# Patient Record
Sex: Female | Born: 1960 | Race: White | Hispanic: No | Marital: Married | Smoking: Never smoker
Health system: Southern US, Community
[De-identification: ages and names within clinical notes are randomized; demographics above are authoritative.]

## PROBLEM LIST (undated history)

## (undated) DIAGNOSIS — K219 Gastro-esophageal reflux disease without esophagitis: Secondary | ICD-10-CM

## (undated) DIAGNOSIS — R112 Nausea with vomiting, unspecified: Secondary | ICD-10-CM

## (undated) DIAGNOSIS — S060X9A Concussion with loss of consciousness of unspecified duration, initial encounter: Secondary | ICD-10-CM

## (undated) DIAGNOSIS — K5900 Constipation, unspecified: Secondary | ICD-10-CM

## (undated) DIAGNOSIS — F329 Major depressive disorder, single episode, unspecified: Secondary | ICD-10-CM

## (undated) DIAGNOSIS — F99 Mental disorder, not otherwise specified: Secondary | ICD-10-CM

## (undated) DIAGNOSIS — F419 Anxiety disorder, unspecified: Secondary | ICD-10-CM

## (undated) DIAGNOSIS — T8859XA Other complications of anesthesia, initial encounter: Secondary | ICD-10-CM

## (undated) DIAGNOSIS — M1990 Unspecified osteoarthritis, unspecified site: Secondary | ICD-10-CM

## (undated) DIAGNOSIS — I341 Nonrheumatic mitral (valve) prolapse: Secondary | ICD-10-CM

## (undated) DIAGNOSIS — F32A Depression, unspecified: Secondary | ICD-10-CM

## (undated) DIAGNOSIS — Z9889 Other specified postprocedural states: Secondary | ICD-10-CM

## (undated) DIAGNOSIS — T4145XA Adverse effect of unspecified anesthetic, initial encounter: Secondary | ICD-10-CM

## (undated) HISTORY — PX: OTHER SURGICAL HISTORY: SHX169

## (undated) HISTORY — PX: CHOLECYSTECTOMY: SHX55

## (undated) HISTORY — PX: TONSILLECTOMY: SUR1361

## (undated) HISTORY — PX: UMBILICAL HERNIA REPAIR: SHX196

## (undated) HISTORY — PX: PILONIDAL CYST / SINUS EXCISION: SUR543

---

## 1980-09-07 DIAGNOSIS — S060X9A Concussion with loss of consciousness of unspecified duration, initial encounter: Secondary | ICD-10-CM

## 1980-09-07 DIAGNOSIS — S060XAA Concussion with loss of consciousness status unknown, initial encounter: Secondary | ICD-10-CM

## 1980-09-07 HISTORY — DX: Concussion with loss of consciousness of unspecified duration, initial encounter: S06.0X9A

## 1980-09-07 HISTORY — DX: Concussion with loss of consciousness status unknown, initial encounter: S06.0XAA

## 1998-09-07 HISTORY — PX: GASTRIC BYPASS: SHX52

## 2013-10-09 ENCOUNTER — Other Ambulatory Visit: Payer: Self-pay | Admitting: Neurological Surgery

## 2013-10-10 ENCOUNTER — Encounter (HOSPITAL_COMMUNITY): Payer: Self-pay | Admitting: Pharmacy Technician

## 2013-10-17 ENCOUNTER — Ambulatory Visit (HOSPITAL_COMMUNITY)
Admission: RE | Admit: 2013-10-17 | Discharge: 2013-10-17 | Disposition: A | Discharge status: Home / Self Care | Payer: PRIVATE HEALTH INSURANCE | Source: Ambulatory Visit | Attending: Neurological Surgery | Admitting: Neurological Surgery

## 2013-10-17 ENCOUNTER — Encounter (HOSPITAL_COMMUNITY)
Admission: RE | Admit: 2013-10-17 | Discharge: 2013-10-17 | Disposition: A | Discharge status: Home / Self Care | Payer: PRIVATE HEALTH INSURANCE | Source: Ambulatory Visit | Attending: Neurological Surgery | Admitting: Neurological Surgery

## 2013-10-17 ENCOUNTER — Encounter (HOSPITAL_COMMUNITY): Payer: Self-pay

## 2013-10-17 DIAGNOSIS — Z01818 Encounter for other preprocedural examination: Secondary | ICD-10-CM | POA: Insufficient documentation

## 2013-10-17 DIAGNOSIS — Z01810 Encounter for preprocedural cardiovascular examination: Secondary | ICD-10-CM | POA: Insufficient documentation

## 2013-10-17 HISTORY — DX: Concussion with loss of consciousness of unspecified duration, initial encounter: S06.0X9A

## 2013-10-17 HISTORY — DX: Depression, unspecified: F32.A

## 2013-10-17 HISTORY — DX: Constipation, unspecified: K59.00

## 2013-10-17 HISTORY — DX: Adverse effect of unspecified anesthetic, initial encounter: T41.45XA

## 2013-10-17 HISTORY — DX: Gastro-esophageal reflux disease without esophagitis: K21.9

## 2013-10-17 HISTORY — DX: Anxiety disorder, unspecified: F41.9

## 2013-10-17 HISTORY — DX: Major depressive disorder, single episode, unspecified: F32.9

## 2013-10-17 HISTORY — DX: Unspecified osteoarthritis, unspecified site: M19.90

## 2013-10-17 HISTORY — DX: Mental disorder, not otherwise specified: F99

## 2013-10-17 HISTORY — DX: Nausea with vomiting, unspecified: R11.2

## 2013-10-17 HISTORY — DX: Nausea with vomiting, unspecified: Z98.890

## 2013-10-17 HISTORY — DX: Other complications of anesthesia, initial encounter: T88.59XA

## 2013-10-17 HISTORY — DX: Nonrheumatic mitral (valve) prolapse: I34.1

## 2013-10-17 LAB — BASIC METABOLIC PANEL
BUN: 7 mg/dL (ref 6–23)
CO2: 27 meq/L (ref 19–32)
CREATININE: 0.59 mg/dL (ref 0.50–1.10)
Calcium: 9.1 mg/dL (ref 8.4–10.5)
Chloride: 103 mEq/L (ref 96–112)
GFR calc Af Amer: >90 mL/min (ref >90)
GFR calc non Af Amer: >90 mL/min (ref >90)
Glucose, Bld: 107 mg/dL — ABNORMAL HIGH (ref 70–99)
Potassium: 4.4 mEq/L (ref 3.7–5.3)
SODIUM: 141 meq/L (ref 137–147)

## 2013-10-17 LAB — TYPE AND SCREEN
ABO/RH(D): A NEG
ANTIBODY SCREEN: NEGATIVE

## 2013-10-17 LAB — CBC WITH DIFFERENTIAL/PLATELET
Basophils Absolute: 0 10*3/uL (ref 0.0–0.1)
Basophils Relative: 0 % (ref 0–1)
Eosinophils Absolute: 0.2 10*3/uL (ref 0.0–0.7)
Eosinophils Relative: 2 % (ref 0–5)
HEMATOCRIT: 34 % — AB (ref 36.0–46.0)
Hemoglobin: 11.4 g/dL — ABNORMAL LOW (ref 12.0–15.0)
LYMPHS ABS: 4.3 10*3/uL — AB (ref 0.7–4.0)
LYMPHS PCT: 48 % — AB (ref 12–46)
MCH: 29 pg (ref 26.0–34.0)
MCHC: 33.5 g/dL (ref 30.0–36.0)
MCV: 86.5 fL (ref 78.0–100.0)
MONO ABS: 0.5 10*3/uL (ref 0.1–1.0)
Monocytes Relative: 6 % (ref 3–12)
Neutro Abs: 4 10*3/uL (ref 1.7–7.7)
Neutrophils Relative %: 45 % (ref 43–77)
Platelets: 278 10*3/uL (ref 150–400)
RBC: 3.93 MIL/uL (ref 3.87–5.11)
RDW: 13.5 % (ref 11.5–15.5)
WBC: 9 10*3/uL (ref 4.0–10.5)

## 2013-10-17 LAB — HCG, SERUM, QUALITATIVE: Preg, Serum: NEGATIVE

## 2013-10-17 LAB — ABO/RH: ABO/RH(D): A NEG

## 2013-10-17 LAB — SURGICAL PCR SCREEN
MRSA, PCR: NEGATIVE
STAPHYLOCOCCUS AUREUS: POSITIVE — AB

## 2013-10-17 LAB — PROTIME-INR
INR: 0.98 (ref 0.00–1.49)
Prothrombin Time: 12.8 seconds (ref 11.6–15.2)

## 2013-10-17 NOTE — Progress Notes (Signed)
[facility] - Preparing for Surgery  Before surgery, you can play an important role.  Because skin is not sterile, your skin needs to be as free of germs as possible.  You can reduce the number of germs on you skin by washing with CHG (chlorahexidine gluconate) soap before surgery.  CHG is an antiseptic cleaner which kills germs and bonds with the skin to continue killing germs even after washing.  Please DO NOT use if you have an allergy to CHG or antibacterial soaps.  If your skin becomes reddened/irritated stop using the CHG and inform your nurse when you arrive at Short Stay.  Do not shave (including legs and underarms) for at least 48 hours prior to the first CHG shower.  You may shave your face.  Please follow these instructions carefully:   1.  Shower with CHG Soap the night before surgery and the morning of Surgery.  2.  If you choose to wash your hair, wash your hair first as usual with your normal shampoo.  3.  After you shampoo, rinse your hair and body thoroughly to remove the  Shampoo.  4.  Use CHG as you would any other liquid soap.  You can apply chg directly to the skin and wash gently with scrungie or a clean washcloth.  5.  Apply the CHG Soap to your body ONLY FROM THE NECK DOWN.        Do not use on open wounds or open sores.  Avoid contact with your eyes,ears, mouth and genitals (private parts).  Wash genitals (private parts)with your normal soap.  6.  Wash thoroughly, paying special attention to the area where your surgery will be performed.  7.  Thoroughly rinse your body with warm water from the neck down.  8.  DO NOT shower/wash with your normal soap after using and rinsing off the CHG Soap.  9.  Pat yourself dry with a clean towel.            10.  Wear clean pajamas.            11.  Place clean sheets on your bed the night of your first shower and do not sleep with pets.  Day of Surgery  Do not apply any lotions/deodorants the morning of surgery.  Please wear clean  clothes to the hospital/surgery center. 

## 2013-10-17 NOTE — Pre-Procedure Instructions (Signed)
Bronson Curbaula C Suppa  10/17/2013   Your procedure is scheduled on:  Thursday, February 12.  [facility] En[facility]ance [facility]/Entrance "A" at 10:48 AM.  Call this number if you have problems the morning of surgery: [phone]   Remember:   Do not eat food or drink liquids after midnight Wednesday.   Take these medicines the morning of surgery with A SIP OF WATER: citalopram (CELEXA),omeprazole (PRILOSEC). Take if needed: LORazepam (ATIVAN),traMADol (ULTRAM).    Do not wear jewelry, make-up or nail polish.  Do not wear lotions, powders, or perfumes. You may wear deodorant.   Men may shave face and neck.  Do not bring valuables to the hospital.  [facility] is not responsible for any belongings or valuables.               Contacts, dentures or bridgework may not be worn into surgery.  Leave suitcase in the car. After surgery it may be brought to your room.  For patients admitted to the hospital, discharge time is determined by your treatment team.               Patients discharged the day of surgery will not be allowed to drive home.  Name and phone number of your driver:   Special Instruction: Shower with Surgical Scrub the night before surgery and the morning of Surgery.   Please read over the following fact sheets that you were given: Pain Booklet, Coughing and Deep Breathing, Blood Transfusion Information and Surgical Site Infection Prevention

## 2013-10-17 NOTE — Progress Notes (Addendum)
PCR positive for staph. I called a prescription for Mupirocin to [facility] , [place], [place].

## 2013-10-17 NOTE — Progress Notes (Signed)
[place] [facility] - Preparing for Surgery ° °Before surgery, you can play an important role.  Because skin is not sterile, your skin needs to be as free of germs as possible.  You can reduce the number of germs on you skin by washing with CHG (chlorahexidine gluconate) soap before surgery.  CHG is an antiseptic cleaner which kills germs and bonds with the skin to continue killing germs even after washing. ° °Please DO NOT use if you have an allergy to CHG or antibacterial soaps.  If your skin becomes reddened/irritated stop using the CHG and inform your nurse when you arrive at Short Stay. ° °Do not shave (including legs and underarms) for at least 48 hours prior to the first CHG shower.  You may shave your face. ° °Please follow these instructions carefully: ° ° 1.  Shower with CHG Soap the night before surgery and the                                morning of Surgery. ° 2.  If you choose to wash your hair, wash your hair first as usual with your       normal shampoo. ° 3.  After you shampoo, rinse your hair and body thoroughly to remove the                      Shampoo. ° 4.  Use CHG as you would any other liquid soap.  You can apply chg directly       to the skin and wash gently with scrungie or a clean washcloth. ° 5.  Apply the CHG Soap to your body ONLY FROM THE NECK DOWN.        Do not use on open wounds or open sores.  Avoid contact with your eyes,       ears, mouth and genitals (private parts).  Wash genitals (private parts)       with your normal soap. ° 6.  Wash thoroughly, paying special attention to the area where your surgery        will be performed. ° 7.  Thoroughly rinse your body with warm water from the neck down. ° 8.  DO NOT shower/wash with your normal soap after using and rinsing off       the CHG Soap. ° 9.  Pat yourself dry with a clean towel. °           10.  Wear clean pajamas. °           11.  Place clean sheets on your bed the night of your first shower and do not        sleep with  pets. ° °Day of Surgery ° °Do not apply any lotions/deoderants the morning of surgery.  Please wear clean clothes to the hospital/surgery center. ° ° °

## 2013-10-18 MED ORDER — CEFAZOLIN SODIUM-DEXTROSE 2-3 GM-% IV SOLR
2.0000 g | INTRAVENOUS | Status: AC
  Administered 2013-10-19: 2 g via INTRAVENOUS
  Filled 2013-10-18: qty 50

## 2013-10-18 MED ORDER — DEXAMETHASONE SODIUM PHOSPHATE 10 MG/ML IJ SOLN
10.0000 mg | INTRAMUSCULAR | Status: AC
  Administered 2013-10-19: 10 mg via INTRAVENOUS
  Filled 2013-10-18: qty 1

## 2013-10-19 ENCOUNTER — Encounter (HOSPITAL_COMMUNITY)
Admission: RE | Disposition: A | Discharge status: Home / Self Care | Payer: PRIVATE HEALTH INSURANCE | Source: Ambulatory Visit | Attending: Neurological Surgery

## 2013-10-19 ENCOUNTER — Encounter (HOSPITAL_COMMUNITY): Payer: PRIVATE HEALTH INSURANCE | Admitting: Anesthesiology

## 2013-10-19 ENCOUNTER — Inpatient Hospital Stay (HOSPITAL_COMMUNITY): Payer: PRIVATE HEALTH INSURANCE | Admitting: Anesthesiology

## 2013-10-19 ENCOUNTER — Inpatient Hospital Stay (HOSPITAL_COMMUNITY)
Admission: RE | Admit: 2013-10-19 | Discharge: 2013-10-21 | DRG: 460 | Disposition: A | Discharge status: Home / Self Care | Payer: PRIVATE HEALTH INSURANCE | Source: Ambulatory Visit | Attending: Neurological Surgery | Admitting: Neurological Surgery

## 2013-10-19 ENCOUNTER — Inpatient Hospital Stay (HOSPITAL_COMMUNITY): Payer: PRIVATE HEALTH INSURANCE

## 2013-10-19 ENCOUNTER — Encounter (HOSPITAL_COMMUNITY): Payer: Self-pay | Admitting: *Deleted

## 2013-10-19 DIAGNOSIS — Z91018 Allergy to other foods: Secondary | ICD-10-CM

## 2013-10-19 DIAGNOSIS — Z79899 Other long term (current) drug therapy: Secondary | ICD-10-CM

## 2013-10-19 DIAGNOSIS — F411 Generalized anxiety disorder: Secondary | ICD-10-CM | POA: Diagnosis present

## 2013-10-19 DIAGNOSIS — Z889 Allergy status to unspecified drugs, medicaments and biological substances status: Secondary | ICD-10-CM

## 2013-10-19 DIAGNOSIS — M129 Arthropathy, unspecified: Secondary | ICD-10-CM | POA: Diagnosis present

## 2013-10-19 DIAGNOSIS — Z981 Arthrodesis status: Secondary | ICD-10-CM

## 2013-10-19 DIAGNOSIS — I059 Rheumatic mitral valve disease, unspecified: Secondary | ICD-10-CM | POA: Diagnosis present

## 2013-10-19 DIAGNOSIS — Q762 Congenital spondylolisthesis: Secondary | ICD-10-CM

## 2013-10-19 DIAGNOSIS — F3289 Other specified depressive episodes: Secondary | ICD-10-CM | POA: Diagnosis present

## 2013-10-19 DIAGNOSIS — M48061 Spinal stenosis, lumbar region without neurogenic claudication: Principal | ICD-10-CM | POA: Diagnosis present

## 2013-10-19 DIAGNOSIS — F329 Major depressive disorder, single episode, unspecified: Secondary | ICD-10-CM | POA: Diagnosis present

## 2013-10-19 DIAGNOSIS — K219 Gastro-esophageal reflux disease without esophagitis: Secondary | ICD-10-CM | POA: Diagnosis present

## 2013-10-19 HISTORY — PX: MAXIMUM ACCESS (MAS)POSTERIOR LUMBAR INTERBODY FUSION (PLIF) 2 LEVEL: SHX6369

## 2013-10-19 SURGERY — FOR MAXIMUM ACCESS (MAS) POSTERIOR LUMBAR INTERBODY FUSION (PLIF) 2 LEVEL
Anesthesia: General | Site: Back

## 2013-10-19 MED ORDER — EPHEDRINE SULFATE 50 MG/ML IJ SOLN
INTRAMUSCULAR | Status: AC
  Filled 2013-10-19: qty 1

## 2013-10-19 MED ORDER — LACTATED RINGERS IV SOLN
INTRAVENOUS | Status: DC
  Administered 2013-10-19 (×3): via INTRAVENOUS

## 2013-10-19 MED ORDER — DEXAMETHASONE SODIUM PHOSPHATE 4 MG/ML IJ SOLN
4.0000 mg | Freq: Four times a day (QID) | INTRAMUSCULAR | Status: DC
  Filled 2013-10-19 (×8): qty 1

## 2013-10-19 MED ORDER — PROPOFOL 10 MG/ML IV BOLUS
INTRAVENOUS | Status: DC | PRN
  Administered 2013-10-19: 100 mg via INTRAVENOUS

## 2013-10-19 MED ORDER — METHOCARBAMOL 500 MG PO TABS
500.0000 mg | ORAL_TABLET | Freq: Four times a day (QID) | ORAL | Status: DC | PRN
  Administered 2013-10-19 – 2013-10-21 (×3): 500 mg via ORAL
  Filled 2013-10-19 (×2): qty 1

## 2013-10-19 MED ORDER — THROMBIN 20000 UNITS EX SOLR
CUTANEOUS | Status: DC | PRN
  Administered 2013-10-19: 14:00:00 via TOPICAL

## 2013-10-19 MED ORDER — PANTOPRAZOLE SODIUM 40 MG PO TBEC
40.0000 mg | DELAYED_RELEASE_TABLET | Freq: Every day | ORAL | Status: DC
  Administered 2013-10-19 – 2013-10-21 (×3): 40 mg via ORAL
  Filled 2013-10-19 (×3): qty 1

## 2013-10-19 MED ORDER — SODIUM CHLORIDE 0.9 % IJ SOLN
3.0000 mL | Freq: Two times a day (BID) | INTRAMUSCULAR | Status: DC
  Administered 2013-10-19: 3 mL via INTRAVENOUS

## 2013-10-19 MED ORDER — MIDAZOLAM HCL 5 MG/5ML IJ SOLN
INTRAMUSCULAR | Status: DC | PRN
  Administered 2013-10-19: 2 mg via INTRAVENOUS

## 2013-10-19 MED ORDER — ACETAMINOPHEN 650 MG RE SUPP: 650.0000 mg | RECTAL | Status: DC | PRN

## 2013-10-19 MED ORDER — THROMBIN 5000 UNITS EX SOLR
OROMUCOSAL | Status: DC | PRN
  Administered 2013-10-19: 14:00:00 via TOPICAL

## 2013-10-19 MED ORDER — MENTHOL 3 MG MT LOZG
1.0000 | LOZENGE | OROMUCOSAL | Status: DC | PRN
Start: 2013-10-19 — End: 2013-10-21
  Administered 2013-10-19: 3 mg via ORAL
  Filled 2013-10-19: qty 9

## 2013-10-19 MED ORDER — HYDROMORPHONE HCL PF 1 MG/ML IJ SOLN
INTRAMUSCULAR | Status: AC
  Filled 2013-10-19: qty 1

## 2013-10-19 MED ORDER — MORPHINE SULFATE 4 MG/ML IJ SOLN
INTRAMUSCULAR | Status: AC
  Filled 2013-10-19: qty 1

## 2013-10-19 MED ORDER — ONDANSETRON HCL 4 MG/2ML IJ SOLN
INTRAMUSCULAR | Status: AC
  Filled 2013-10-19: qty 2

## 2013-10-19 MED ORDER — SUCCINYLCHOLINE CHLORIDE 20 MG/ML IJ SOLN
INTRAMUSCULAR | Status: DC | PRN
  Administered 2013-10-19: 40 mg via INTRAVENOUS
  Administered 2013-10-19: 120 mg via INTRAVENOUS

## 2013-10-19 MED ORDER — SODIUM CHLORIDE 0.9 % IV SOLN
10.0000 mg | INTRAVENOUS | Status: DC | PRN
  Administered 2013-10-19: 25 ug/min via INTRAVENOUS

## 2013-10-19 MED ORDER — 0.9 % SODIUM CHLORIDE (POUR BTL) OPTIME
TOPICAL | Status: DC | PRN
  Administered 2013-10-19: 1000 mL

## 2013-10-19 MED ORDER — ONDANSETRON HCL 4 MG/2ML IJ SOLN
4.0000 mg | Freq: Once | INTRAMUSCULAR | Status: AC | PRN
  Administered 2013-10-19: 4 mg via INTRAVENOUS

## 2013-10-19 MED ORDER — ONDANSETRON HCL 4 MG/2ML IJ SOLN
INTRAMUSCULAR | Status: AC
Start: 2013-10-19 — End: 2013-10-20
  Filled 2013-10-19: qty 2

## 2013-10-19 MED ORDER — POTASSIUM CHLORIDE IN NACL 20-0.9 MEQ/L-% IV SOLN
INTRAVENOUS | Status: DC
  Administered 2013-10-19: 21:00:00 via INTRAVENOUS
  Filled 2013-10-19 (×4): qty 1000

## 2013-10-19 MED ORDER — MORPHINE SULFATE 2 MG/ML IJ SOLN
1.0000 mg | INTRAMUSCULAR | Status: DC | PRN
Start: 2013-10-19 — End: 2013-10-21
  Administered 2013-10-19: 2 mg via INTRAVENOUS
  Administered 2013-10-19: 4 mg via INTRAVENOUS
  Administered 2013-10-20: 2 mg via INTRAVENOUS
  Filled 2013-10-19 (×2): qty 1

## 2013-10-19 MED ORDER — HYDROMORPHONE HCL PF 1 MG/ML IJ SOLN
0.2500 mg | INTRAMUSCULAR | Status: DC | PRN
  Administered 2013-10-19 (×4): 0.5 mg via INTRAVENOUS

## 2013-10-19 MED ORDER — METHOCARBAMOL 500 MG PO TABS
ORAL_TABLET | ORAL | Status: AC
  Administered 2013-10-19: 500 mg via ORAL
  Filled 2013-10-19: qty 1

## 2013-10-19 MED ORDER — OXYCODONE-ACETAMINOPHEN 5-325 MG PO TABS
1.0000 | ORAL_TABLET | ORAL | Status: DC | PRN
  Administered 2013-10-19 – 2013-10-21 (×8): 2 via ORAL
  Filled 2013-10-19 (×7): qty 2

## 2013-10-19 MED ORDER — LIDOCAINE HCL (CARDIAC) 20 MG/ML IV SOLN
INTRAVENOUS | Status: AC
Start: 2013-10-19 — End: 2013-10-19
  Filled 2013-10-19: qty 5

## 2013-10-19 MED ORDER — SUFENTANIL CITRATE 50 MCG/ML IV SOLN
INTRAVENOUS | Status: DC | PRN
  Administered 2013-10-19: 10 ug via INTRAVENOUS
  Administered 2013-10-19 (×4): 5 ug via INTRAVENOUS

## 2013-10-19 MED ORDER — MEPERIDINE HCL 25 MG/ML IJ SOLN: 6.2500 mg | INTRAMUSCULAR | Status: DC | PRN

## 2013-10-19 MED ORDER — ONDANSETRON HCL 4 MG/2ML IJ SOLN: 4.0000 mg | INTRAMUSCULAR | Status: DC | PRN

## 2013-10-19 MED ORDER — LORAZEPAM 1 MG PO TABS
1.0000 mg | ORAL_TABLET | Freq: Two times a day (BID) | ORAL | Status: DC | PRN
  Administered 2013-10-19 – 2013-10-20 (×2): 1 mg via ORAL
  Filled 2013-10-19 (×2): qty 1

## 2013-10-19 MED ORDER — CEFAZOLIN SODIUM 1-5 GM-% IV SOLN
1.0000 g | Freq: Three times a day (TID) | INTRAVENOUS | Status: AC
  Administered 2013-10-19 – 2013-10-20 (×2): 1 g via INTRAVENOUS
  Filled 2013-10-19 (×2): qty 50

## 2013-10-19 MED ORDER — SODIUM CHLORIDE 0.9 % IJ SOLN
INTRAMUSCULAR | Status: AC
  Filled 2013-10-19: qty 10

## 2013-10-19 MED ORDER — MIDAZOLAM HCL 2 MG/2ML IJ SOLN
INTRAMUSCULAR | Status: AC
  Filled 2013-10-19: qty 2

## 2013-10-19 MED ORDER — OXYCODONE HCL 5 MG PO TABS: 5.0000 mg | ORAL_TABLET | Freq: Once | ORAL | Status: DC | PRN

## 2013-10-19 MED ORDER — OXYCODONE HCL 5 MG/5ML PO SOLN: 5.0000 mg | Freq: Once | ORAL | Status: DC | PRN

## 2013-10-19 MED ORDER — SODIUM CHLORIDE 0.9 % IR SOLN
Status: DC | PRN
  Administered 2013-10-19: 14:00:00

## 2013-10-19 MED ORDER — SODIUM CHLORIDE 0.9 % IJ SOLN
3.0000 mL | INTRAMUSCULAR | Status: DC | PRN
Start: 2013-10-19 — End: 2013-10-21

## 2013-10-19 MED ORDER — FENTANYL CITRATE 0.05 MG/ML IJ SOLN
INTRAMUSCULAR | Status: AC
  Filled 2013-10-19: qty 5

## 2013-10-19 MED ORDER — SCOPOLAMINE 1 MG/3DAYS TD PT72
MEDICATED_PATCH | TRANSDERMAL | Status: AC
  Filled 2013-10-19: qty 1

## 2013-10-19 MED ORDER — SUCCINYLCHOLINE CHLORIDE 20 MG/ML IJ SOLN
INTRAMUSCULAR | Status: AC
  Filled 2013-10-19: qty 1

## 2013-10-19 MED ORDER — PHENOL 1.4 % MT LIQD: 1.0000 | OROMUCOSAL | Status: DC | PRN

## 2013-10-19 MED ORDER — PROPOFOL 10 MG/ML IV BOLUS
INTRAVENOUS | Status: AC
  Filled 2013-10-19: qty 20

## 2013-10-19 MED ORDER — METHOCARBAMOL 100 MG/ML IJ SOLN: 500.0000 mg | Freq: Four times a day (QID) | INTRAVENOUS | Status: DC | PRN

## 2013-10-19 MED ORDER — DEXAMETHASONE 4 MG PO TABS
4.0000 mg | ORAL_TABLET | Freq: Four times a day (QID) | ORAL | Status: DC
  Administered 2013-10-19 – 2013-10-21 (×7): 4 mg via ORAL
  Filled 2013-10-19 (×11): qty 1

## 2013-10-19 MED ORDER — ROCURONIUM BROMIDE 50 MG/5ML IV SOLN
INTRAVENOUS | Status: AC
  Filled 2013-10-19: qty 1

## 2013-10-19 MED ORDER — ONDANSETRON HCL 4 MG/2ML IJ SOLN
INTRAMUSCULAR | Status: DC | PRN
  Administered 2013-10-19 (×2): 4 mg via INTRAVENOUS

## 2013-10-19 MED ORDER — CITALOPRAM HYDROBROMIDE 40 MG PO TABS
40.0000 mg | ORAL_TABLET | Freq: Every day | ORAL | Status: DC
  Administered 2013-10-20 – 2013-10-21 (×2): 40 mg via ORAL
  Filled 2013-10-19 (×3): qty 1

## 2013-10-19 MED ORDER — LIDOCAINE HCL (CARDIAC) 20 MG/ML IV SOLN
INTRAVENOUS | Status: DC | PRN
  Administered 2013-10-19: 100 mg via INTRAVENOUS

## 2013-10-19 MED ORDER — ACETAMINOPHEN 325 MG PO TABS: 650.0000 mg | ORAL_TABLET | ORAL | Status: DC | PRN

## 2013-10-19 MED ORDER — EPHEDRINE SULFATE 50 MG/ML IJ SOLN
INTRAMUSCULAR | Status: DC | PRN
  Administered 2013-10-19 (×2): 10 mg via INTRAVENOUS
  Administered 2013-10-19: 5 mg via INTRAVENOUS

## 2013-10-19 MED ORDER — SODIUM CHLORIDE 0.9 % IV SOLN: 250.0000 mL | INTRAVENOUS | Status: DC

## 2013-10-19 MED ORDER — SUFENTANIL CITRATE 50 MCG/ML IV SOLN
INTRAVENOUS | Status: AC
  Filled 2013-10-19: qty 1

## 2013-10-19 MED ORDER — FENTANYL CITRATE 0.05 MG/ML IJ SOLN
INTRAMUSCULAR | Status: DC | PRN
  Administered 2013-10-19: 100 ug via INTRAVENOUS
  Administered 2013-10-19: 150 ug via INTRAVENOUS

## 2013-10-19 MED ORDER — OXYCODONE-ACETAMINOPHEN 5-325 MG PO TABS
ORAL_TABLET | ORAL | Status: AC
  Administered 2013-10-19: 2 via ORAL
  Filled 2013-10-19: qty 2

## 2013-10-19 SURGICAL SUPPLY — 73 items
BAG DECANTER FOR FLEXI CONT (MISCELLANEOUS) ×2 IMPLANT
BENZOIN TINCTURE PRP APPL 2/3 (GAUZE/BANDAGES/DRESSINGS) ×2 IMPLANT
BLADE SURG ROTATE 9660 (MISCELLANEOUS) IMPLANT
BONE MATRIX OSTEOCEL PRO MED (Bone Implant) ×2 IMPLANT
BUR MATCHSTICK NEURO 3.0 LAGG (BURR) ×2 IMPLANT
CAGE COROENT LG 10X9X23-12 (Cage) ×8 IMPLANT
CANISTER SUCT 3000ML (MISCELLANEOUS) ×2 IMPLANT
CLIP NEUROVISION LG (CLIP) ×2 IMPLANT
CONT SPEC 4OZ CLIKSEAL STRL BL (MISCELLANEOUS) ×4 IMPLANT
COVER BACK TABLE 24X17X13 BIG (DRAPES) IMPLANT
COVER TABLE BACK 60X90 (DRAPES) ×2 IMPLANT
DRAPE C-ARM 42X72 X-RAY (DRAPES) ×2 IMPLANT
DRAPE C-ARMOR (DRAPES) ×2 IMPLANT
DRAPE LAPAROTOMY 100X72X124 (DRAPES) ×2 IMPLANT
DRAPE POUCH INSTRU U-SHP 10X18 (DRAPES) ×2 IMPLANT
DRAPE SURG 17X23 STRL (DRAPES) ×2 IMPLANT
DRESSING TELFA 8X3 (GAUZE/BANDAGES/DRESSINGS) ×2 IMPLANT
DRSG OPSITE 4X5.5 SM (GAUZE/BANDAGES/DRESSINGS) ×4 IMPLANT
DRSG OPSITE POSTOP 4X6 (GAUZE/BANDAGES/DRESSINGS) ×2 IMPLANT
DURAPREP 26ML APPLICATOR (WOUND CARE) ×2 IMPLANT
ELECT REM PT RETURN 9FT ADLT (ELECTROSURGICAL) ×2
ELECTRODE REM PT RTRN 9FT ADLT (ELECTROSURGICAL) ×1 IMPLANT
EVACUATOR 1/8 PVC DRAIN (DRAIN) ×2 IMPLANT
GAUZE SPONGE 4X4 16PLY XRAY LF (GAUZE/BANDAGES/DRESSINGS) IMPLANT
GLOVE BIO SURGEON STRL SZ8 (GLOVE) ×6 IMPLANT
GLOVE BIOGEL PI IND STRL 7.0 (GLOVE) ×4 IMPLANT
GLOVE BIOGEL PI IND STRL 7.5 (GLOVE) ×1 IMPLANT
GLOVE BIOGEL PI IND STRL 8.5 (GLOVE) ×1 IMPLANT
GLOVE BIOGEL PI INDICATOR 7.0 (GLOVE) ×4
GLOVE BIOGEL PI INDICATOR 7.5 (GLOVE) ×1
GLOVE BIOGEL PI INDICATOR 8.5 (GLOVE) ×1
GLOVE ECLIPSE 7.5 STRL STRAW (GLOVE) ×6 IMPLANT
GLOVE ECLIPSE 8.0 STRL XLNG CF (GLOVE) ×2 IMPLANT
GLOVE INDICATOR 7.5 STRL GRN (GLOVE) ×4 IMPLANT
GLOVE OPTIFIT SS 6.5 STRL BRWN (GLOVE) ×8 IMPLANT
GLOVE SS N UNI LF 7.0 STRL (GLOVE) ×4 IMPLANT
GOWN BRE IMP SLV AUR LG STRL (GOWN DISPOSABLE) IMPLANT
GOWN BRE IMP SLV AUR XL STRL (GOWN DISPOSABLE) IMPLANT
GOWN STRL REIN 2XL LVL4 (GOWN DISPOSABLE) IMPLANT
GOWN STRL REUS W/ TWL LRG LVL3 (GOWN DISPOSABLE) ×1 IMPLANT
GOWN STRL REUS W/ TWL XL LVL3 (GOWN DISPOSABLE) ×2 IMPLANT
GOWN STRL REUS W/TWL 2XL LVL3 (GOWN DISPOSABLE) ×2 IMPLANT
GOWN STRL REUS W/TWL LRG LVL3 (GOWN DISPOSABLE) ×1
GOWN STRL REUS W/TWL XL LVL3 (GOWN DISPOSABLE) ×2
HEMOSTAT POWDER KIT SURGIFOAM (HEMOSTASIS) IMPLANT
KIT BASIN OR (CUSTOM PROCEDURE TRAY) ×2 IMPLANT
KIT NEEDLE NVM5 EMG ELECT (KITS) ×1 IMPLANT
KIT NEEDLE NVM5 EMG ELECTRODE (KITS) ×1
KIT ROOM TURNOVER OR (KITS) ×2 IMPLANT
MILL MEDIUM DISP (BLADE) IMPLANT
NEEDLE HYPO 25X1 1.5 SAFETY (NEEDLE) ×2 IMPLANT
NS IRRIG 1000ML POUR BTL (IV SOLUTION) ×2 IMPLANT
PACK LAMINECTOMY NEURO (CUSTOM PROCEDURE TRAY) ×2 IMPLANT
PAD ARMBOARD 7.5X6 YLW CONV (MISCELLANEOUS) ×6 IMPLANT
ROD 60MM LUMBAR (Rod) ×4 IMPLANT
SCREW LOCK (Screw) ×6 IMPLANT
SCREW LOCK FXNS SPNE MAS PL (Screw) ×6 IMPLANT
SCREW SHANK 5.0X30MM (Screw) ×8 IMPLANT
SCREW SHANK PLIF 5.0X25 LUMBAR (Screw) ×4 IMPLANT
SCREW TULIP 5.5 (Screw) ×12 IMPLANT
SPONGE LAP 4X18 X RAY DECT (DISPOSABLE) IMPLANT
SPONGE SURGIFOAM ABS GEL 100 (HEMOSTASIS) ×2 IMPLANT
STRIP CLOSURE SKIN 1/2X4 (GAUZE/BANDAGES/DRESSINGS) ×2 IMPLANT
SUT VIC AB 0 CT1 18XCR BRD8 (SUTURE) ×1 IMPLANT
SUT VIC AB 0 CT1 8-18 (SUTURE) ×1
SUT VIC AB 2-0 CP2 18 (SUTURE) ×2 IMPLANT
SUT VIC AB 3-0 SH 8-18 (SUTURE) ×4 IMPLANT
SYR 20ML ECCENTRIC (SYRINGE) ×2 IMPLANT
SYR 3ML LL SCALE MARK (SYRINGE) IMPLANT
TOWEL OR 17X24 6PK STRL BLUE (TOWEL DISPOSABLE) ×2 IMPLANT
TOWEL OR 17X26 10 PK STRL BLUE (TOWEL DISPOSABLE) ×2 IMPLANT
TRAY FOLEY CATH 14FRSI W/METER (CATHETERS) ×2 IMPLANT
WATER STERILE IRR 1000ML POUR (IV SOLUTION) ×2 IMPLANT

## 2013-10-19 NOTE — Anesthesia Procedure Notes (Signed)
Procedure Name: Intubation Date/Time: 10/19/2013 1:01 PM Performed by: Sharlene DoryWALKER, Maicy Filip E Pre-anesthesia Checklist: Patient identified, Emergency Drugs available, Suction available, Patient being monitored and Timeout performed Patient Re-evaluated:Patient Re-evaluated prior to inductionOxygen Delivery Method: Circle system utilized Preoxygenation: Pre-oxygenation with 100% oxygen Intubation Type: IV induction Ventilation: Mask ventilation without difficulty Laryngoscope Size: Mac and 3 Grade View: Grade I Tube type: Oral Tube size: 7.0 mm Number of attempts: 1 Airway Equipment and Method: Stylet Placement Confirmation: ETT inserted through vocal cords under direct vision,  positive ETCO2 and breath sounds checked- equal and bilateral Secured at: 21 cm Tube secured with: Tape Dental Injury: Teeth and Oropharynx as per pre-operative assessment

## 2013-10-19 NOTE — Op Note (Signed)
10/19/2013  4:46 PM  PATIENT:  Christy Nolan  53 y.o. female  PRE-OPERATIVE DIAGNOSIS:  1. Spinal stenosis L3-4 and L4-5, 2. Spondylolisthesis L3-4 and L4-5, 3. Back pain, 4. Leg pain  POST-OPERATIVE DIAGNOSIS:  Same  PROCEDURE:   1. Decompressive lumbar laminectomy L3-4 and L4-5 requiring more work than would be required for a simple exposure of the disk for PLIF in order to adequately decompress the neural elements and address the spinal stenosis 2. Posterior lumbar interbody fusion L3-4 and L4-5 using PEEK interbody cages packed with morcellized allograft and autograft 3. Posterior fixation L3-L5 inclusive using cortical pedicle screws.   SURGEON:  Marikay Alaravid Nidia Grogan, MD  ASSISTANTS: Dr. Venetia MaxonStern  ANESTHESIA:  General  EBL: 550 ml  Total I/O In: 2000 [I.V.:2000] Out: 1025 [Urine:475; Blood:550]  BLOOD ADMINISTERED:none  DRAINS: Hemovac   INDICATION FOR PROCEDURE: This patient presented with a long history of back and bilateral leg pain. Plain films with flexion extension views showed spondylolisthesis L3-4 and L4-5. MRI showed severe spinal stenosis at L3-4 and L4-5. She tried medical management without relief. Recommended a 2 level decompression and instrumented fusion to address her severe stenosis and her segmental instability. Patient understood the risks, benefits, and alternatives and potential outcomes and wished to proceed.  PROCEDURE DETAILS:  The patient was brought to the operating room. After induction of generalized endotracheal anesthesia the patient was rolled into the prone position on chest rolls and all pressure points were padded. The patient's lumbar region was cleaned and then prepped with DuraPrep and draped in the usual sterile fashion. Anesthesia was injected and then a dorsal midline incision was made and carried down to the lumbosacral fascia. The fascia was opened and the paraspinous musculature was taken down in a subperiosteal fashion to expose L3-4 and L4-5. A  self-retaining retractor was placed. Intraoperative fluoroscopy confirmed my level, and I started with placement of the L3 cortical pedicle screws. The pedicle screw entry zones were identified utilizing surface landmarks and  AP and lateral fluoroscopy. I scored the cortex with the high-speed drill and then used the hand drill and EMG monitoring to drill an upward and outward direction into the pedicle. I then tapped line to line, and the tap was also monitored. I then placed a 5-0 x 25 mm cortical pedicle screw into the pedicles of L3 bilaterally. I then turned my attention to the decompression and the spinous process was removed and complete lumbar laminectomies, hemi- facetectomies, and foraminotomies were performed at L3-4 and L4-5. The patient had significant spinal stenosis and this required more work than would be required for a simple exposure of the disc for posterior lumbar interbody fusion. Much more generous decompression was undertaken in order to adequately decompress the neural elements and address the patient's leg pain. The yellow ligament was removed to expose the underlying dura and nerve roots, and generous foraminotomies were performed to adequately decompress the neural elements. Both the exiting and traversing nerve roots were decompressed on both sides until a coronary dilator passed easily along the nerve roots. Once the decompression was complete, I turned my attention to the posterior lower lumbar interbody fusion. The epidural venous vasculature was coagulated and cut sharply. Disc space was incised and the initial discectomy was performed with pituitary rongeurs. The disc space was distracted with sequential distractors to a height of 10 mm at both levels. We then used a series of scrapers and shavers to prepare the endplates for fusion. The midline was prepared with Epstein curettes.  Once the complete discectomy was finished, we packed an appropriate sized peek interbody cage with  local autograft and morcellized allograft, gently retracted the nerve root, and tapped the cage into position at L3-4 and L4-5.  The midline between the cages was packed with morselized autograft and allograft at both levels. We then turned our attention to the placement of the lower pedicle screws. The pedicle screw entry zones were identified utilizing surface landmarks and fluoroscopy. I drilled into each pedicle utilizing the hand drill and EMG monitoring, and tapped each pedicle with the appropriate tap. We palpated with a ball probe to assure no break in the cortex. We then placed 5-0 x 30 mm pedicle screws into the pedicles bilaterally at L4 and L5.  We then placed lordotic rods into the multiaxial screw heads of the pedicle screws and locked these in position with the locking caps and anti-torque device. We then checked our construct with AP and lateral fluoroscopy. Irrigated with copious amounts of bacitracin-containing saline solution. Placed a medium Hemovac drain through separate stab incision. Inspected the nerve roots once again to assure adequate decompression, lined to the dura with Gelfoam, and closed the muscle and the fascia with 0 Vicryl. Closed the subcutaneous tissues with 2-0 Vicryl and subcuticular tissues with 3-0 Vicryl. The skin was closed with benzoin and Steri-Strips. Dressing was then applied, the patient was awakened from general anesthesia and transported to the recovery room in stable condition. At the end of the procedure all sponge, needle and instrument counts were correct.   PLAN OF CARE: Admit to inpatient   PATIENT DISPOSITION:  PACU - hemodynamically stable.   Delay start of Pharmacological VTE agent (>24hrs) due to surgical blood loss or risk of bleeding:  yes

## 2013-10-19 NOTE — Transfer of Care (Signed)
Immediate Anesthesia Transfer of Care Note  Patient: Christy Nolan  Procedure(s) Performed: Procedure(s) with comments: FOR MAXIMUM ACCESS (MAS) POSTERIOR LUMBAR INTERBODY FUSION (PLIF) 2 LEVEL (N/A) - FOR MAXIMUM ACCESS (MAS) POSTERIOR LUMBAR INTERBODY FUSION (PLIF) 2 LEVEL  Patient Location: PACU  Anesthesia Type:General  Level of Consciousness: awake, sedated and patient cooperative  Airway & Oxygen Therapy: Patient Spontanous Breathing and Patient connected to face mask oxygen  Post-op Assessment: Report given to PACU RN, Post -op Vital signs reviewed and stable and Patient moving all extremities  Post vital signs: Reviewed and stable  Complications: No apparent anesthesia complications

## 2013-10-19 NOTE — Anesthesia Postprocedure Evaluation (Signed)
Anesthesia Post Note  Patient: Christy Nolan  Procedure(s) Performed: Procedure(s) (LRB): FOR MAXIMUM ACCESS (MAS) POSTERIOR LUMBAR INTERBODY FUSION (PLIF) 2 LEVEL (N/A)  Anesthesia type: general  Patient location: PACU  Post pain: Pain level controlled  Post assessment: Patient's Cardiovascular Status Stable  Last Vitals:  Filed Vitals:   10/19/13 1653  BP: 116/59  Pulse: 110  Temp: 36.6 C  Resp: 24    Post vital signs: Reviewed and stable  Level of consciousness: sedated  Complications: No apparent anesthesia complications

## 2013-10-19 NOTE — Progress Notes (Signed)
Pt arrived to the unit via stretcher, IV intact and infusing, foley intact, hemavac intact and suctioning, dsg to back dry and intact, oriented to the unit, call light at side and family in room. Pt remains comfortable and denies any pain. Will continue to monitor quietly.

## 2013-10-19 NOTE — Anesthesia Preprocedure Evaluation (Addendum)
Anesthesia Evaluation  Patient identified by MRN, date of birth, ID band Patient awake    Reviewed: Allergy & Precautions, H&P , NPO status , Patient's Chart, lab work & pertinent test results  History of Anesthesia Complications (+) PONV  Airway Mallampati: I TM Distance: >3 FB Neck ROM: Full    Dental  (+) Edentulous Upper, Edentulous Lower   Pulmonary          Cardiovascular     Neuro/Psych Anxiety Depression    GI/Hepatic GERD-  Medicated,  Endo/Other    Renal/GU      Musculoskeletal   Abdominal   Peds  Hematology   Anesthesia Other Findings   Reproductive/Obstetrics                          Anesthesia Physical Anesthesia Plan  ASA: II  Anesthesia Plan: General   Post-op Pain Management:    Induction: Intravenous  Airway Management Planned: Oral ETT  Additional Equipment:   Intra-op Plan:   Post-operative Plan: Extubation in OR  Informed Consent: I have reviewed the patients History and Physical, chart, labs and discussed the procedure including the risks, benefits and alternatives for the proposed anesthesia with the patient or authorized representative who has indicated his/her understanding and acceptance.     Plan Discussed with: CRNA and Surgeon  Anesthesia Plan Comments:         Anesthesia Quick Evaluation
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
## 2013-10-19 NOTE — Preoperative (Signed)
Beta Blockers   Reason not to administer Beta Blockers:Not Applicable 

## 2013-10-20 ENCOUNTER — Encounter (HOSPITAL_COMMUNITY): Payer: Self-pay | Admitting: Neurological Surgery

## 2013-10-20 NOTE — Evaluation (Signed)
Occupational Therapy Evaluation Patient Details Name: Christy Nolan MRN: [id number] DOB: 1961/02/26 Today's Date: 10/20/2013 Time: 5784-6962 OT Time Calculation (min): 43 min  OT Assessment / Plan / Recommendation History of present illness Patient is s/p 2 level PLIF.   Clinical Impression   Pt admitted with above.  She demonstrates the below listed deficits and will benefit from continued OT to maximize safety and independence with BADLs.  She demonstrates good understanding of back precautions.  Is supervision with BADLs.  Will see in morning to complete edcuation.     OT Assessment  Patient needs continued OT Services    Follow Up Recommendations  No OT follow up;Supervision/Assistance - 24 hour    Barriers to Discharge      Equipment Recommendations  None recommended by OT    Recommendations for Other Services    Frequency  Min 2X/week    Precautions / Restrictions Precautions Precautions: Back Precaution Comments: Pt able to recall 2/3 back precautions.  Needs min verbal cues during activities Required Braces or Orthoses: Spinal Brace Spinal Brace: Lumbar corset Restrictions Weight Bearing Restrictions: No   Pertinent Vitals/Pain     ADL  Eating/Feeding: Independent Where Assessed - Eating/Feeding: Bed level;Chair Grooming: Wash/dry hands;Wash/dry face;Teeth care;Brushing hair;Supervision/safety Where Assessed - Grooming: Unsupported standing Upper Body Bathing: Supervision/safety Where Assessed - Upper Body Bathing: Unsupported standing Lower Body Bathing: Supervision/safety Where Assessed - Lower Body Bathing: Unsupported standing Upper Body Dressing: Supervision/safety Where Assessed - Upper Body Dressing: Unsupported sitting Lower Body Dressing: Supervision/safety Where Assessed - Lower Body Dressing: Unsupported sit to stand (with use of AE) Toilet Transfer: Supervision/safety Toilet Transfer Method: Sit to stand;Stand pivot Acupuncturist:  Comfort height toilet Toileting - Clothing Manipulation and Hygiene: Supervision/safety Where Assessed - Engineer, mining and Hygiene: Sit to stand from 3-in-1 or toilet Tub/Shower Transfer: Supervision/safety Tub/Shower Transfer Method: Science writer: Walk in Scientist, research (physical sciences) Used: Back brace;Rolling walker Transfers/Ambulation Related to ADLs: S ADL Comments: Pt instructed in use and acquisition of AE.  Pt provided with elastic shoe laces.  Discussed safety with IADLs and instructed her no laundry, vacuuming/sweeping, changing bed linens, etc.     OT Diagnosis: Generalized weakness;Acute pain  OT Problem List: Decreased strength;Decreased knowledge of use of DME or AE;Decreased knowledge of precautions;Pain OT Treatment Interventions: Self-care/ADL training;DME and/or AE instruction;Therapeutic activities;Patient/family education   OT Goals(Current goals can be found in the care plan section) Acute Rehab OT Goals Patient Stated Goal: to get better OT Goal Formulation: With patient Time For Goal Achievement: 10/27/13 Potential to Achieve Goals: Good ADL Goals Pt Will Perform Grooming: with modified independence;standing Pt Will Perform Upper Body Bathing: with modified independence;standing;sitting Pt Will Perform Lower Body Bathing: with modified independence;sit to/from stand;with adaptive equipment Pt Will Perform Upper Body Dressing: with modified independence;sitting Pt Will Perform Lower Body Dressing: with modified independence;sit to/from stand;with adaptive equipment Pt Will Transfer to Toilet: with modified independence;ambulating;regular height toilet Pt Will Perform Toileting - Clothing Manipulation and hygiene: with modified independence;sit to/from stand  Visit Information  Last OT Received On: 10/20/13 Assistance Needed: +1 History of Present Illness: Patient is s/p 2 level PLIF.       Prior Functioning     Home  Living Family/patient expects to be discharged to:: Private residence Living Arrangements: Spouse/significant other Available Help at Discharge: Family Type of Home: Mobile home Home Access: Stairs to enter Entrance Stairs-Number of Steps: 6 Entrance Stairs-Rails: Can reach both Home Layout: One level Home Equipment: Dan Humphreys -  2 wheels;Shower seat Prior Function Level of Independence: Independent Communication Communication:  (glasses) Dominant Hand: Right         Vision/Perception     Cognition  Cognition Arousal/Alertness: Awake/alert Behavior During Therapy: WFL for tasks assessed/performed Overall Cognitive Status: Within Functional Limits for tasks assessed    Extremity/Trunk Assessment Upper Extremity Assessment Upper Extremity Assessment: Defer to OT evaluation Lower Extremity Assessment Lower Extremity Assessment: Defer to PT evaluation     Mobility Bed Mobility Overal bed mobility: Needs Assistance Bed Mobility: Rolling;Sidelying to Sit Rolling: Supervision Sidelying to sit: Supervision Sit to sidelying: Supervision General bed mobility comments: verbal cues for log rolling Transfers Overall transfer level: Needs assistance Equipment used: Rolling walker (2 wheeled) Transfers: Sit to/from Stand Sit to Stand: Supervision General transfer comment: VCs for hand placement     Exercise     Balance Balance Overall balance assessment: No apparent balance deficits (not formally assessed)   End of Session OT - End of Session Equipment Utilized During Treatment: Rolling walker;Back brace Activity Tolerance: Patient tolerated treatment well Patient left: in bed;with call bell/phone within reach;with family/visitor present  GO     Christy Nolan, Ursula AlertWendi M 10/20/2013, 2:32 PM

## 2013-10-20 NOTE — Evaluation (Signed)
Physical Therapy Evaluation Patient Details Name: Christy Nolan MRN: [id number] DOB: 1961/05/29 Today's Date: 10/20/2013 Time: [id number] PT Time Calculation (min): 27 min  PT Assessment / Plan / Recommendation History of Present Illness  Patient is s/p 2 level PLIF.  Clinical Impression  Patient demonstrates deficits in functional mobility as indicated below. Will benefit from continued skilled PT to address deficits and maximize function. Will see as indicated and progress as tolerated.    PT Assessment  Patient needs continued PT services    Follow Up Recommendations  No PT follow up;Supervision/Assistance - 24 hour    Does the patient have the potential to tolerate intense rehabilitation      Barriers to Discharge        Equipment Recommendations  Rolling walker with 5" wheels    Recommendations for Other Services     Frequency Min 5X/week    Precautions / Restrictions Precautions Precautions: Back Precaution Comments: educated and reviewed in detail via teachback Required Braces or Orthoses: Spinal Brace Spinal Brace: Lumbar corset Restrictions Weight Bearing Restrictions: No   Pertinent Vitals/Pain 6/10      Mobility  Bed Mobility Overal bed mobility: Needs Assistance Bed Mobility: Rolling;Sidelying to Sit Rolling: Supervision Sidelying to sit: Supervision General bed mobility comments: no physical assist needed Transfers Overall transfer level: Needs assistance Equipment used: Rolling walker (2 wheeled) Transfers: Sit to/from Stand Sit to Stand: Supervision General transfer comment: VCs for hand placement Ambulation/Gait Ambulation/Gait assistance: Supervision Ambulation Distance (Feet): 430 Feet Assistive device: Rolling walker (2 wheeled) Gait Pattern/deviations: Step-through pattern Gait velocity: decreased modestly Gait velocity interpretation: Below normal speed for age/gender General Gait Details: steady with ambulation    Exercises     PT  Diagnosis: Difficulty walking;Abnormality of gait;Acute pain  PT Problem List: Decreased activity tolerance;Decreased mobility;Pain PT Treatment Interventions: DME instruction;Gait training;Stair training;Functional mobility training;Therapeutic activities;Therapeutic exercise;Balance training;Patient/family education     PT Goals(Current goals can be found in the care plan section) Acute Rehab PT Goals Patient Stated Goal: to go home and be able to sit with her grandson PT Goal Formulation: With patient Time For Goal Achievement: 11/03/13 Potential to Achieve Goals: Good  Visit Information  Last PT Received On: 10/20/13 Assistance Needed: +1 History of Present Illness: Patient is s/p 2 level PLIF.       Prior Functioning  Home Living Family/patient expects to be discharged to:: Private residence Living Arrangements: Spouse/significant other Available Help at Discharge: Family Type of Home: Mobile home Home Access: Stairs to enter Secretary/administratorntrance Stairs-Number of Steps: 6 Entrance Stairs-Rails: Can reach both Home Layout: One level Home Equipment: Environmental consultantWalker - 2 wheels Prior Function Level of Independence: Independent Communication Communication:  (glasses) Dominant Hand: Right    Cognition  Cognition Arousal/Alertness: Awake/alert Behavior During Therapy: WFL for tasks assessed/performed Overall Cognitive Status: Within Functional Limits for tasks assessed    Extremity/Trunk Assessment Upper Extremity Assessment Upper Extremity Assessment: Overall WFL for tasks assessed Lower Extremity Assessment Lower Extremity Assessment: Overall WFL for tasks assessed   Balance Balance Overall balance assessment: No apparent balance deficits (not formally assessed)  End of Session PT - End of Session Equipment Utilized During Treatment: Gait belt;Back brace Activity Tolerance: Patient tolerated treatment well Patient left: in chair;with call bell/phone within reach;with family/visitor  present Nurse Communication: Mobility status  GP     Fabio AsaWerner, Elwin Tsou J 10/20/2013, 12:09 PM Christy Nolan, PT DPT  [phone]

## 2013-10-20 NOTE — Progress Notes (Signed)
UR complete.  Ely Spragg RN, MSN 

## 2013-10-20 NOTE — Progress Notes (Signed)
Took out IV per patient's request. Pt's states that "it was uncomfortable."  IV site clean and dry. Pt does not have IV access.

## 2013-10-20 NOTE — Progress Notes (Signed)
Patient ID: Christy Nolan, female   DOB: Feb 11, 1961, 53 y.o.   MRN: [id number] Subjective: Patient reports she's doing great. Mild back soreness which is expected. No leg pain or numbness or tingling.  Objective: Vital signs in last 24 hours: Temp:  [97.9 F (36.6 C)-98.9 F (37.2 C)] 98 F (36.7 C) (02/13 0524) Pulse Rate:  [61-110] 61 (02/13 0524) Resp:  [17-25] 18 (02/13 0524) BP: (105-146)/(57-83) 123/57 mmHg (02/13 0524) SpO2:  [98 %-100 %] 100 % (02/13 0524) Weight:  [83.2 kg (183 lb 6.8 oz)] 83.2 kg (183 lb 6.8 oz) (02/12 2142)  Intake/Output from previous day: 02/12 0701 - 02/13 0700 In: 2483 [P.O.:480; I.V.:2003] Out: 3200 [Urine:2425; Drains:225; Blood:550] Intake/Output this shift:    Neurologic: Grossly normal  Lab Results: Lab Results  Component Value Date   WBC 9.0 10/17/2013   HGB 11.4* 10/17/2013   HCT 34.0* 10/17/2013   MCV 86.5 10/17/2013   PLT 278 10/17/2013   Lab Results  Component Value Date   INR 0.98 10/17/2013   BMET Lab Results  Component Value Date   NA 141 10/17/2013   K 4.4 10/17/2013   CL 103 10/17/2013   CO2 27 10/17/2013   GLUCOSE 107* 10/17/2013   BUN 7 10/17/2013   CREATININE 0.59 10/17/2013   CALCIUM 9.1 10/17/2013    Studies/Results: Dg Lumbar Spine 2-3 Views  10/19/2013   CLINICAL DATA:  53 year old female undergoing lumbar surgery. Initial encounter.  EXAM: LUMBAR SPINE - 2-3 VIEW; DG C-ARM 61-120 MIN  COMPARISON:  Outside spine MRI 06/29/2013 [facility]  FLUOROSCOPY TIME:  1 min 26 seconds.  FINDINGS: Three level lumbar posterior and interbody fusion plus decompression. Hardware appears intact. The more caudal fused level corresponds to that demonstrating spondylolisthesis on the comparison.  IMPRESSION: 3 level lumbar posterior and interbody fusion and decompression.   Electronically Signed   By: Augusto GambleLee  Hall M.D.   On: 10/19/2013 16:48   Dg C-arm 61-120 Min  10/19/2013   CLINICAL DATA:  53 year old female undergoing  lumbar surgery. Initial encounter.  EXAM: LUMBAR SPINE - 2-3 VIEW; DG C-ARM 61-120 MIN  COMPARISON:  Outside spine MRI 06/29/2013 [facility]).  FLUOROSCOPY TIME:  1 min 26 seconds.  FINDINGS: Three level lumbar posterior and interbody fusion plus decompression. Hardware appears intact. The more caudal fused level corresponds to that demonstrating spondylolisthesis on the comparison.  IMPRESSION: 3 level lumbar posterior and interbody fusion and decompression.   Electronically Signed   By: Augusto GambleLee  Hall M.D.   On: 10/19/2013 16:48    Assessment/Plan: As you doing very well on postop day 1. She will get physical and outpatient therapy today. Likely home tomorrow.   LOS: 1 day    JONES,DAVID S 10/20/2013, 8:35 AM

## 2013-10-21 MED ORDER — METHOCARBAMOL 500 MG PO TABS: 500.0000 mg | ORAL_TABLET | Freq: Four times a day (QID) | ORAL | Status: AC | PRN

## 2013-10-21 MED ORDER — OXYCODONE-ACETAMINOPHEN 5-325 MG PO TABS: 1.0000 | ORAL_TABLET | ORAL | Status: AC | PRN

## 2013-10-21 NOTE — Discharge Summary (Signed)
  Physician Discharge Summary  Patient ID: Christy Nolan MRN: [id number] DOB/AGE: 07-Sep-1961 53 y.o.  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 female admitted after elective uncomplicated L3-4, L4-5 PLIF. Postoperatively she was at her neurologic baseline with good strength. Her preoperative leg pain was resolved. She ambulated with PT/OT without difficulty. Pain controlled with oral medications. She was voiding without difficulty and tolerating diet. She was stable for d/c on POD#2.  Treatments: Surgery - L3-4, L4-5 PLIF  Discharge Exam: Blood pressure 127/64, pulse 78, temperature 98.4 F (36.9 C), temperature source Oral, resp. rate 18, height 5\' 2"  (1.575 m), weight 83.2 kg (183 lb 6.8 oz), SpO2 99.00%. Awake, alert, oriented Speech fluent, appropriate CN grossly intact 5/5 BUE/BLE Wound c/d/i  Follow-up: Follow-up in Dr. Yetta BarreJones' office [facility]( Neurosurgery and Spine [phone]) in 2-3 weeks  Disposition: Home     Medication List         citalopram 40 MG tablet  Commonly known as:  CELEXA  Take 40 mg by mouth daily.     Ferrous Gluconate 325 (36 FE) MG Tabs  Take 1 tablet by mouth daily.     LORazepam 1 MG tablet  Commonly known as:  ATIVAN  Take 1 mg by mouth 2 (two) times daily as needed for anxiety.     methocarbamol 500 MG tablet  Commonly known as:  ROBAXIN  Take 1 tablet (500 mg total) by mouth every 6 (six) hours as needed for muscle spasms.     omeprazole 20 MG capsule  Commonly known as:  PRILOSEC  Take 20 mg by mouth daily.     oxyCODONE-acetaminophen 5-325 MG per tablet  Commonly known as:  PERCOCET/ROXICET  Take 1-2 tablets by mouth every 4 (four) hours as needed for moderate pain.     traMADol 50 MG tablet  Commonly known as:  ULTRAM  Take 50 mg by mouth every 6 (six) hours as needed for moderate pain.         SignedLisbeth Renshaw: Elazar Argabright, C 10/21/2013, 8:51 AM

## 2013-10-21 NOTE — Plan of Care (Signed)
Problem: Acute Rehab OT Goals (only OT should resolve) Goal: Pt. Will Perform Lower Body Bathing Outcome: Not Met (add Reason) Family will assist Goal: Pt. Will Perform Lower Body Dressing Outcome: Not Met (add Reason) Family will assist

## 2013-10-21 NOTE — Progress Notes (Signed)
Physical Therapy Treatment Patient Details Name: Christy Nolan MRN: [id number] DOB: October 25, 1960 Today's Date: 10/21/2013 Time: [id number] PT Time Calculation (min): 10 min  PT Assessment / Plan / Recommendation  History of Present Illness Patient is s/p 2 level PLIF.   PT Comments   Pt mobilizing extremely well. Pt safe to d/c home once medically stable.  Follow Up Recommendations  No PT follow up;Supervision/Assistance - 24 hour     Does the patient have the potential to tolerate intense rehabilitation     Barriers to Discharge        Equipment Recommendations       Recommendations for Other Services    Frequency Min 5X/week   Progress towards PT Goals Progress towards PT goals: Progressing toward goals  Plan Current plan remains appropriate    Precautions / Restrictions Precautions Precautions: Back Precaution Booklet Issued: Yes (comment) Precaution Comments: recalled 3/3 back precautions, required min cues during activities not to twist Required Braces or Orthoses: Spinal Brace Spinal Brace: Lumbar corset Restrictions Weight Bearing Restrictions: No   Pertinent Vitals/Pain Reports minimal pain and improved sensation in R LE    Mobility  Bed Mobility Overal bed mobility: Modified Independent General bed mobility comments: pt up in chair upon PT arrival Transfers Overall transfer level: Modified independent Sit to Stand: Modified independent (Device/Increase time) Ambulation/Gait Ambulation/Gait assistance: Supervision Ambulation Distance (Feet): 300 Feet Assistive device: None Gait Pattern/deviations: Step-through pattern Gait velocity: cautious, guarded but no episodes of LOB General Gait Details: steady, no episodes of LOB Stairs: Yes Stairs assistance: Min guard Stair Management: One rail Left (L HHA) Number of Stairs: 6 General stair comments: educated on "up with the good, down with the bad" pt with good understanding    Exercises     PT Diagnosis:     PT Problem List:   PT Treatment Interventions:     PT Goals (current goals can now be found in the care plan section)    Visit Information  Last PT Received On: 10/21/13 Assistance Needed: +1 History of Present Illness: Patient is s/p 2 level PLIF.    Subjective Data      Cognition  Cognition Arousal/Alertness: Awake/alert Behavior During Therapy: WFL for tasks assessed/performed Overall Cognitive Status: Within Functional Limits for tasks assessed    Balance  Balance Overall balance assessment: No apparent balance deficits (not formally assessed)  End of Session PT - End of Session Equipment Utilized During Treatment: Gait belt;Back brace Activity Tolerance: Patient tolerated treatment well Patient left: in chair;with call bell/phone within reach;with family/visitor present Nurse Communication: Mobility status   GP     Marcene BrawnChadwell, [name] 10/21/2013, 10:28 AM  Lewis ShockAshly Christpher Stogsdill, PT, DPT Pager #: [phone] Office #: [phone]

## 2013-10-21 NOTE — Progress Notes (Signed)
Occupational Therapy Treatment Patient Details Name: Christy Nolan MRN: [id number] DOB: 1961/06/13 Today's Date: 10/21/2013 Time: 4098-1191 OT Time Calculation (min): 27 min  OT Assessment / Plan / Recommendation  History of present illness Patient is s/p 2 level PLIF.   OT comments  Pt. Is performing all functional adl tasks at MOD I level.  Will have family assist for LB ADLS so declines need for A/E.  Able to recall and maintain back precautions with min. Cues for "no twisting".  Eager for d/c home.  All acute O.T. Goals met. Will sign off.   Follow Up Recommendations  No OT follow up;Supervision/Assistance - 24 hour           Equipment Recommendations  None recommended by OT        Frequency Min 2X/week   Progress towards OT Goals Progress towards OT goals: Goals met/education completed, patient discharged from [place] Discharge plan remains appropriate    Precautions / Restrictions Precautions Precautions: Back Precaution Comments: recalled 3/3 back precautions, required min cues during activities not to twist Required Braces or Orthoses: Spinal Brace Spinal Brace: Lumbar corset   Pertinent Vitals/Pain 0/10    ADL  Grooming: Simulated;Wash/dry hands;Modified independent Where Assessed - Grooming: Unsupported standing Upper Body Dressing: Performed;Modified independent Where Assessed - Upper Body Dressing: Unsupported sitting Toilet Transfer: Performed;Modified independent Toilet Transfer Method: Sit to Loss adjuster, chartered: Comfort height toilet Toileting - Clothing Manipulation and Hygiene: Modified independent Where Assessed - Best boy and Hygiene: Sit on 3-in-1 or toilet Tub/Shower Transfer: Simulated;Supervision/safety Tub/Shower Transfer Method: Anterior-posterior;Ambulating Tub/Shower Transfer Equipment: Walk in shower Equipment Used: Back brace Transfers/Ambulation Related to ADLs: amb. without use of r.w., no LOB noted,  some intermittent cues for "no twisting" during functional tasks ADL Comments: donned brace with mod I sitting, defers LB self care to family who will be available to assist.  shower transfer s.      OT Goals(current goals can now be found in the care plan section)    Visit Information  Last OT Received On: 10/21/13 History of Present Illness: Patient is s/p 2 level PLIF.                Cognition  Cognition Arousal/Alertness: Awake/alert Behavior During Therapy: WFL for tasks assessed/performed Overall Cognitive Status: Within Functional Limits for tasks assessed    Mobility  Bed Mobility Overal bed mobility: Modified Independent General bed mobility comments: hob flat, no rails, rolled right no cues required for safety or tech.  Transfers Overall transfer level: Modified independent Sit to Stand: Modified independent (Device/Increase time)              End of Session OT - End of Session Equipment Utilized During Treatment: Back brace Activity Tolerance: Patient tolerated treatment well Patient left: in chair;with family/visitor present;with call bell/phone within reach       Janice Coffin, COTA/L 10/21/2013, 8:25 AM

## 2013-10-21 NOTE — Progress Notes (Signed)
I have read and agree with this note. Cathy Jayleen Scaglione, OTR/L [phone] 10/21/2013   

## 2013-10-21 NOTE — Progress Notes (Signed)
Pt d/c to home by car with family. Assessment stable. Prescriptions given. Pt verbalizes understanding of d/c instructions. 

## 2013-10-23 MED FILL — Heparin Sodium (Porcine) Inj 1000 Unit/ML: INTRAMUSCULAR | Qty: 30 | Status: AC

## 2013-10-23 MED FILL — Sodium Chloride IV Soln 0.9%: INTRAVENOUS | Qty: 1000 | Status: AC

## 2015-05-23 IMAGING — CR DG CHEST 2V
2 series · 2 of 2 positions shown · non-contrast
Comparison: None.

CLINICAL DATA: Preop evaluation

EXAM:
CHEST  2 VIEW

[w chest pa]
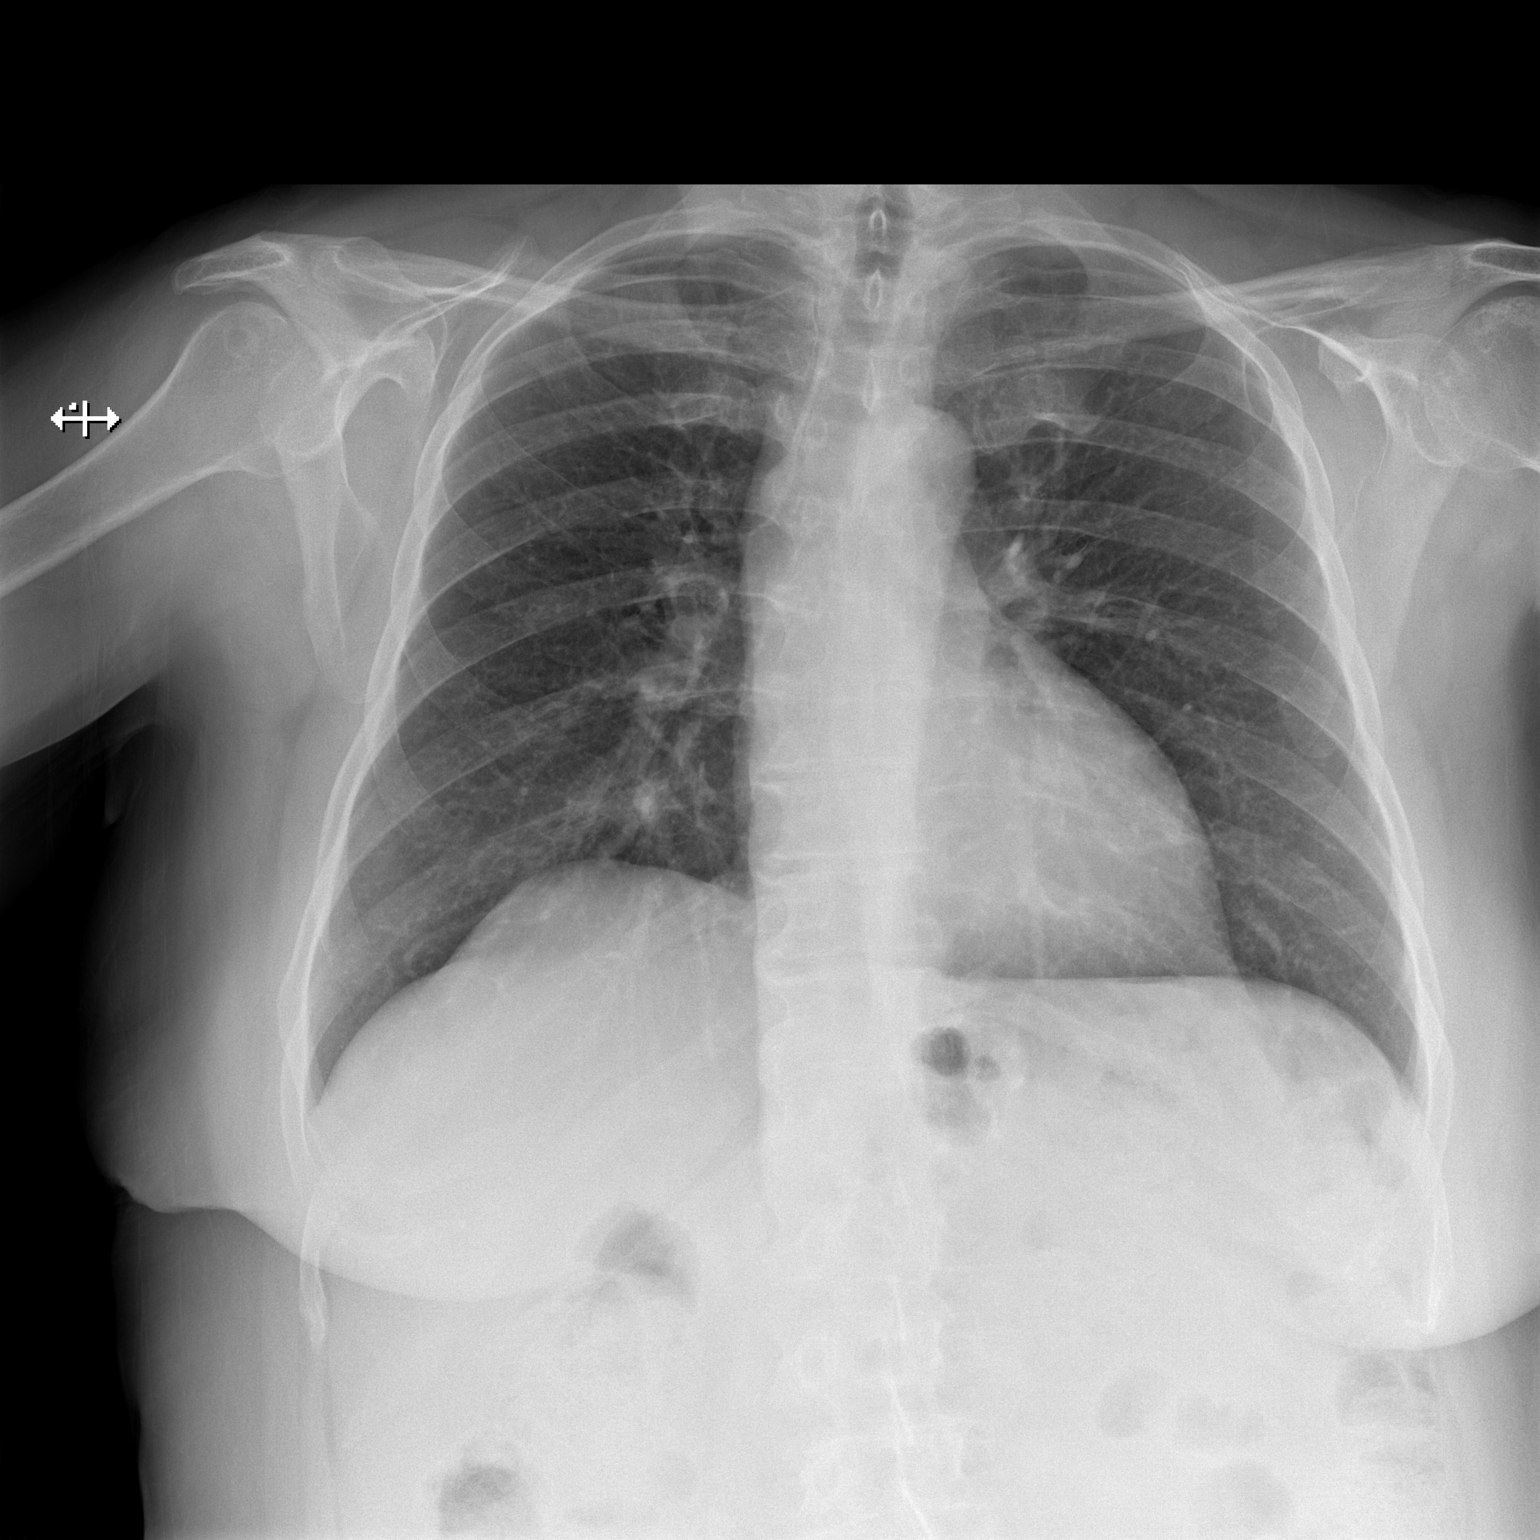

[w chest lat]
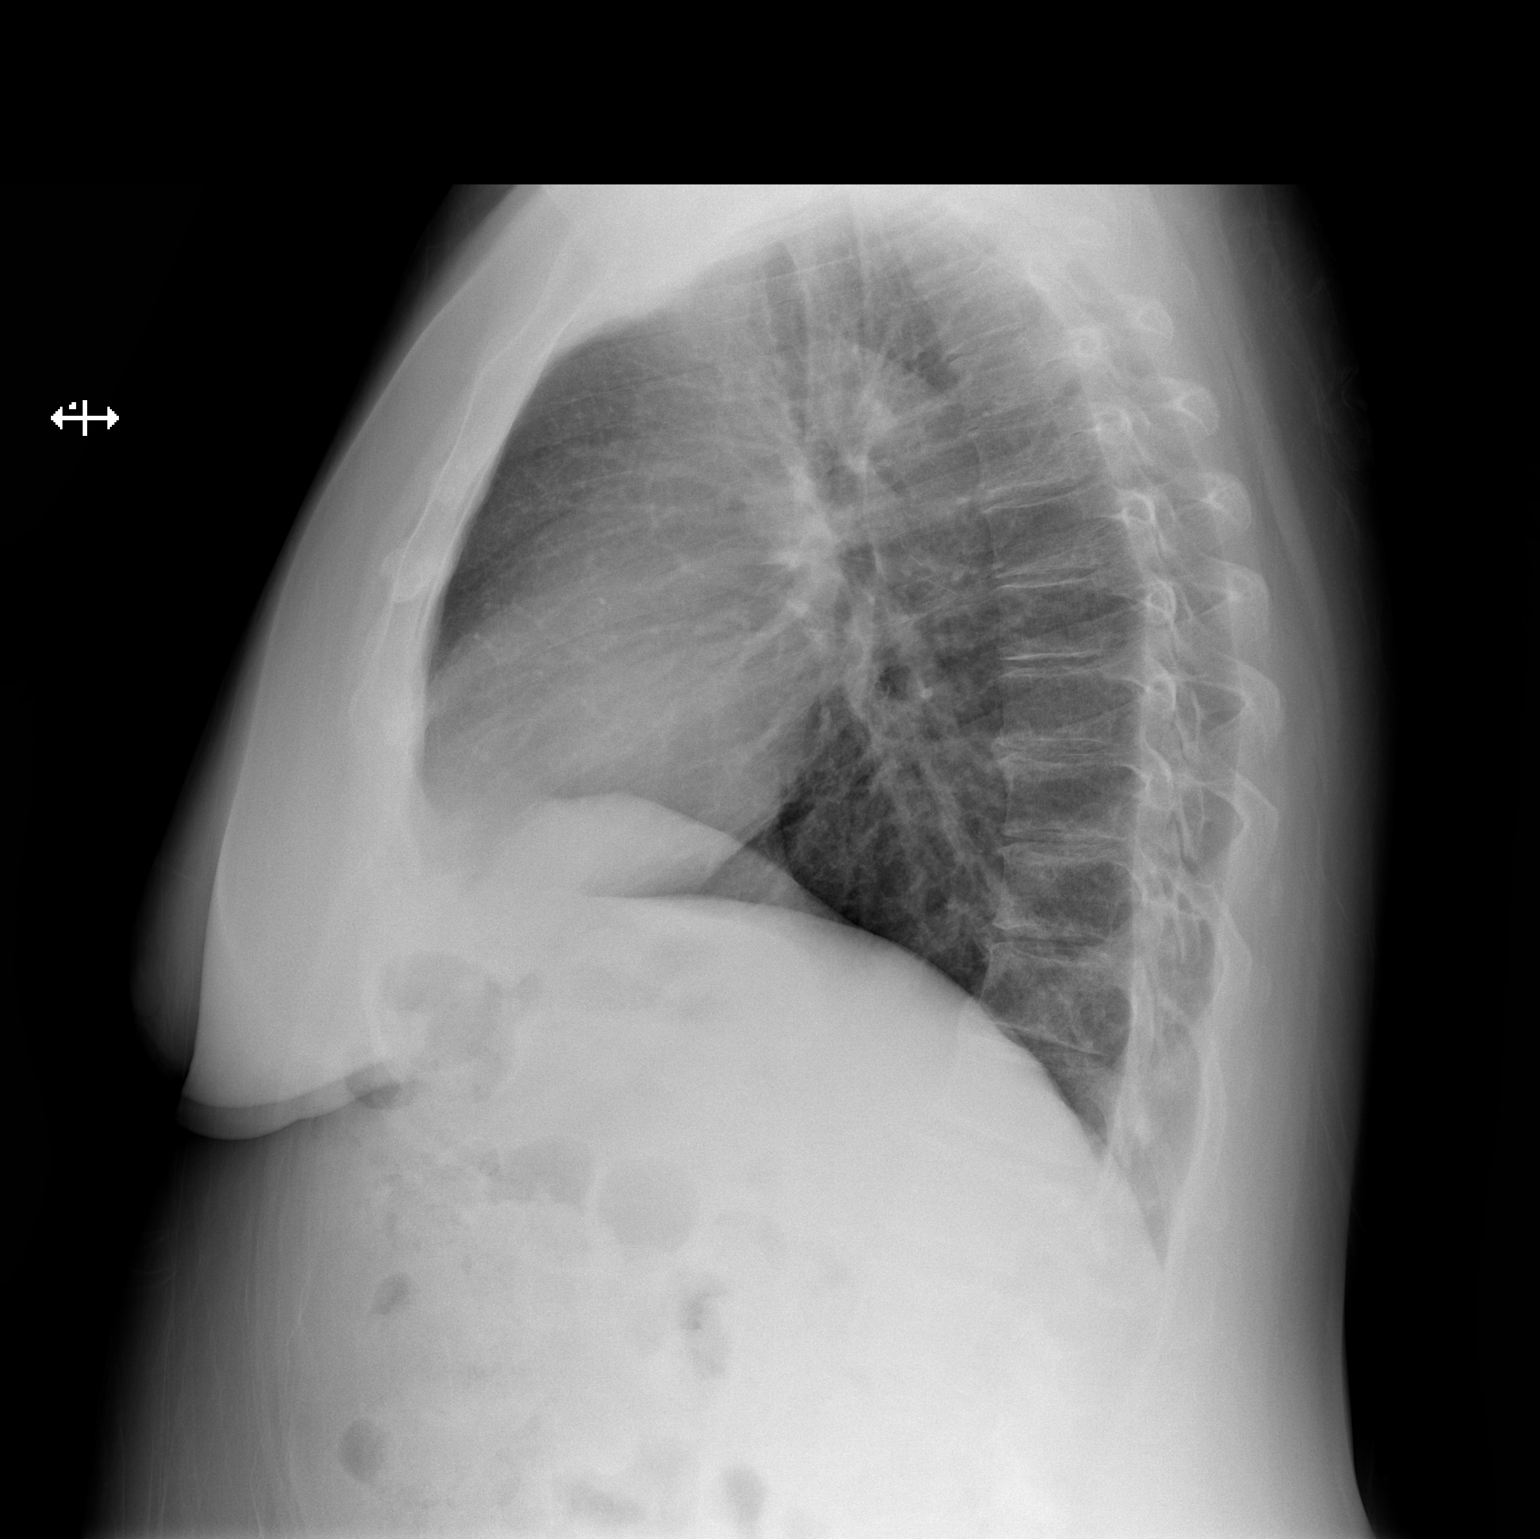

[2 of 2 positions shown; findings below may reference images not displayed]

FINDINGS: The heart size and mediastinal contours are within normal limits.
Both lungs are clear. There is mild dextroscoliosis of the thoracic
spine may be positional. Osseous structures otherwise grossly
unremarkable.
IMPRESSION: No active cardiopulmonary disease.
